# Patient Record
Sex: Male | Born: 1985 | Race: White | Hispanic: No | Marital: Single | Smoking: Former smoker
Health system: Southern US, Community
[De-identification: ages and names within clinical notes are randomized; demographics above are authoritative.]

---

## 2016-03-03 ENCOUNTER — Encounter (HOSPITAL_BASED_OUTPATIENT_CLINIC_OR_DEPARTMENT_OTHER): Payer: Self-pay | Admitting: *Deleted

## 2016-03-03 ENCOUNTER — Emergency Department (HOSPITAL_BASED_OUTPATIENT_CLINIC_OR_DEPARTMENT_OTHER)
Admission: EM | Admit: 2016-03-03 | Discharge: 2016-03-03 | Disposition: A | Discharge status: Home / Self Care | Payer: Self-pay | Attending: Emergency Medicine | Admitting: Emergency Medicine

## 2016-03-03 DIAGNOSIS — R252 Cramp and spasm: Secondary | ICD-10-CM

## 2016-03-03 DIAGNOSIS — M62838 Other muscle spasm: Secondary | ICD-10-CM | POA: Insufficient documentation

## 2016-03-03 DIAGNOSIS — F17210 Nicotine dependence, cigarettes, uncomplicated: Secondary | ICD-10-CM | POA: Insufficient documentation

## 2016-03-03 DIAGNOSIS — R748 Abnormal levels of other serum enzymes: Secondary | ICD-10-CM | POA: Insufficient documentation

## 2016-03-03 LAB — CBC WITH DIFFERENTIAL/PLATELET
BASOS ABS: 0 10*3/uL (ref 0.0–0.1)
BASOS PCT: 0 %
EOS ABS: 0.1 10*3/uL (ref 0.0–0.7)
EOS PCT: 1 %
HCT: 43.3 % (ref 39.0–52.0)
Hemoglobin: 15.1 g/dL (ref 13.0–17.0)
Lymphocytes Relative: 31 %
Lymphs Abs: 2.8 10*3/uL (ref 0.7–4.0)
MCH: 31.2 pg (ref 26.0–34.0)
MCHC: 34.9 g/dL (ref 30.0–36.0)
MCV: 89.5 fL (ref 78.0–100.0)
MONO ABS: 0.8 10*3/uL (ref 0.1–1.0)
Monocytes Relative: 9 %
Neutro Abs: 5.3 10*3/uL (ref 1.7–7.7)
Neutrophils Relative %: 59 %
PLATELETS: 307 10*3/uL (ref 150–400)
RBC: 4.84 MIL/uL (ref 4.22–5.81)
RDW: 12.6 % (ref 11.5–15.5)
WBC: 9 10*3/uL (ref 4.0–10.5)

## 2016-03-03 LAB — COMPREHENSIVE METABOLIC PANEL
ALBUMIN: 5.2 g/dL — AB (ref 3.5–5.0)
ALT: 28 U/L (ref 17–63)
AST: 30 U/L (ref 15–41)
Alkaline Phosphatase: 64 U/L (ref 38–126)
Anion gap: 13 (ref 5–15)
BUN: 11 mg/dL (ref 6–20)
CHLORIDE: 105 mmol/L (ref 101–111)
CO2: 22 mmol/L (ref 22–32)
Calcium: 9.3 mg/dL (ref 8.9–10.3)
Creatinine, Ser: 0.78 mg/dL (ref 0.61–1.24)
GFR calc Af Amer: >60 mL/min (ref >60)
GFR calc non Af Amer: >60 mL/min (ref >60)
GLUCOSE: 89 mg/dL (ref 65–99)
POTASSIUM: 4.2 mmol/L (ref 3.5–5.1)
Sodium: 140 mmol/L (ref 135–145)
Total Bilirubin: 0.8 mg/dL (ref 0.3–1.2)
Total Protein: 8.2 g/dL — ABNORMAL HIGH (ref 6.5–8.1)

## 2016-03-03 LAB — CK: CK TOTAL: 445 U/L — AB (ref 49–397)

## 2016-03-03 MED ORDER — SODIUM CHLORIDE 0.9 % IV SOLN
INTRAVENOUS | Status: DC
  Administered 2016-03-03: 16:00:00 via INTRAVENOUS

## 2016-03-03 NOTE — ED Provider Notes (Signed)
CSN: [id number]     Arrival date & time 03/03/16  1349 History   First MD Initiated Contact with Patient 03/03/16 1504     Chief Complaint  Patient presents with  . Follow-up    HPI Pt was at work today when he started to feel like his heart was beating abnormally.  He then started to have tingling all over.  Walking out to the car, he couldn't speak, he couldn't walk properly and needed help to get to the care.  His fingers were cramped and locked.  His muscles felt very tense.  [facility] went to [facility].  He was given a paper bag.  He had lab tests.  His symptoms slowly resolved after several hours.  He was told he was hyperventilating and was released.  He was told he had a panic attack.  Pt does not feel that is accurate.   History reviewed. No pertinent past medical history. History reviewed. No pertinent past surgical history. No family history on file. Social History  Substance Use Topics  . Smoking status: Current Every Day Smoker -- 1.00 packs/day    Types: Cigarettes  . Smokeless tobacco: None  . Alcohol Use: Yes    Review of Systems  All other systems reviewed and are negative.     Allergies  Review of patient's allergies indicates no known allergies.  Home Medications   Prior to Admission medications   Not on File   BP 127/83 mmHg  Pulse 70  Temp(Src) 98.1 F (36.7 C) (Oral)  Resp 18  Ht 5\' 11"  (1.803 m)  Wt 74.844 kg  BMI 23.02 kg/m2  SpO2 100% Physical Exam  Constitutional: He is oriented to person, place, and time. He appears well-developed and well-nourished. No distress.  HENT:  Head: Normocephalic and atraumatic.  Right Ear: External ear normal.  Left Ear: External ear normal.  Mouth/Throat: Oropharynx is clear and moist.  Eyes: Conjunctivae are normal. Right eye exhibits no discharge. Left eye exhibits no discharge. No scleral icterus.  Neck: Neck supple. No tracheal deviation present.  Cardiovascular: Normal rate, regular rhythm and intact  distal pulses.   Pulmonary/Chest: Effort normal and breath sounds normal. No stridor. No respiratory distress. He has no wheezes. He has no rales.  Abdominal: Soft. Bowel sounds are normal. He exhibits no distension. There is no tenderness. There is no rebound and no guarding.  Musculoskeletal: He exhibits no edema or tenderness.  Neurological: He is alert and oriented to person, place, and time. He has normal strength. No cranial nerve deficit (No facial droop, extraocular movements intact, tongue midline ) or sensory deficit. He exhibits normal muscle tone. He displays no seizure activity. Coordination normal.  No pronator drift bilateral upper extrem, able to hold both legs off bed for 5 seconds, sensation intact in all extremities, no visual field cuts, no left or right sided neglect, normal finger-nose exam bilaterally, no nystagmus noted   Skin: Skin is warm and dry. No rash noted.  Psychiatric: He has a normal mood and affect.  Nursing note and vitals reviewed.   ED Course  Procedures (including critical care time) Labs Review Labs Reviewed  COMPREHENSIVE METABOLIC PANEL - Abnormal; Notable for the following:    Total Protein 8.2 (*)    Albumin 5.2 (*)    All other components within normal limits  CK - Abnormal; Notable for the following:    Total CK 445 (*)    All other components within normal limits  CBC WITH  DIFFERENTIAL/PLATELET    Imaging Review No results found. I have personally reviewed and evaluated these images and lab results as part of my medical decision-making.   EKG Interpretation   Date/Time:  Thursday March 03 2016 15:29:43 EST Ventricular Rate:  85 PR Interval:  147 QRS Duration: 125 QT Interval:  372 QTC Calculation: 442 R Axis:   98 Text Interpretation:  Sinus rhythm RBBB and LPFB Confirmed by Renezmae Canlas  MD-J,  Fredie Majano ([id number]) on 03/03/2016 3:33:04 PM      MDM   Final diagnoses:  Abnormal CK  Spasms of the hands or feet    The patient's symptoms  are suggestive of possible panic attack associated with carpal pedal spasms. He has no prior history of this. He was not feeling anxious or having any particular mental health issues when it occurred.  He questioned this diagnosis when he went to the other emergency room and decided to come here for a second opinion. The patient has remained asymptomatic. I explained to him that I do not think his symptoms were suggestive of a stroke or TIA considering the bilateral nature of his symptoms. His laboratory tests do show mild elevation in the creatinine kinase which would go along with have him having muscle spasms associated with hyperventilation and electrolyte shifts. I discussed follow-up with the primary care doctor to discuss further treatment options and evaluation.  At this time there does not appear to be any evidence of an acute emergency medical condition and the patient appears stable for discharge with appropriate outpatient follow up.     Linwood Dibbles, MD 03/03/16 [phone]

## 2016-03-03 NOTE — ED Notes (Signed)
States he was treated at [facility] for hyperventilation and discharged. He wants to be rechecked because he does not believe his diagnosis was correct. He is very anxious at triage.

## 2016-03-03 NOTE — ED Notes (Signed)
D/c home with ride. Resources given for follow up

## 2016-03-03 NOTE — ED Notes (Signed)
Patient states he was just seen at [facility] and states he does not believe his diagnosis.  States he developed a rapid HR while sitting at his desk at work.  States his symptoms worsened, and he developed generalized muscle contractions, decreased vision and chest pain.  States his symptoms have now resolved and he wants to have another exam.

## 2017-09-11 ENCOUNTER — Emergency Department (HOSPITAL_COMMUNITY): Payer: Self-pay

## 2017-09-11 ENCOUNTER — Inpatient Hospital Stay (HOSPITAL_COMMUNITY)
Admission: EM | Admit: 2017-09-11 | Discharge: 2017-09-13 | DRG: 473 | Disposition: A | Discharge status: Home / Self Care | Payer: Self-pay | Attending: Neurosurgery | Admitting: Neurosurgery

## 2017-09-11 ENCOUNTER — Encounter (HOSPITAL_COMMUNITY): Payer: Self-pay | Admitting: Pharmacy Technician

## 2017-09-11 DIAGNOSIS — M50223 Other cervical disc displacement at C6-C7 level: Secondary | ICD-10-CM | POA: Diagnosis present

## 2017-09-11 DIAGNOSIS — S129XXA Fracture of neck, unspecified, initial encounter: Secondary | ICD-10-CM

## 2017-09-11 DIAGNOSIS — S12500A Unspecified displaced fracture of sixth cervical vertebra, initial encounter for closed fracture: Principal | ICD-10-CM | POA: Diagnosis present

## 2017-09-11 DIAGNOSIS — R402412 Glasgow coma scale score 13-15, at arrival to emergency department: Secondary | ICD-10-CM | POA: Diagnosis present

## 2017-09-11 DIAGNOSIS — Z23 Encounter for immunization: Secondary | ICD-10-CM

## 2017-09-11 DIAGNOSIS — S13101A Dislocation of unspecified cervical vertebrae, initial encounter: Secondary | ICD-10-CM | POA: Diagnosis present

## 2017-09-11 DIAGNOSIS — Z419 Encounter for procedure for purposes other than remedying health state, unspecified: Secondary | ICD-10-CM

## 2017-09-11 DIAGNOSIS — S0101XA Laceration without foreign body of scalp, initial encounter: Secondary | ICD-10-CM | POA: Diagnosis present

## 2017-09-11 DIAGNOSIS — F17210 Nicotine dependence, cigarettes, uncomplicated: Secondary | ICD-10-CM | POA: Diagnosis present

## 2017-09-11 MED ORDER — TETANUS-DIPHTH-ACELL PERTUSSIS 5-2.5-18.5 LF-MCG/0.5 IM SUSP
0.5000 mL | Freq: Once | INTRAMUSCULAR | Status: AC
  Administered 2017-09-12: 0.5 mL via INTRAMUSCULAR
  Filled 2017-09-11: qty 0.5
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
## 2017-09-12 ENCOUNTER — Encounter (HOSPITAL_COMMUNITY): Payer: Self-pay | Admitting: Certified Registered"

## 2017-09-12 ENCOUNTER — Emergency Department (HOSPITAL_COMMUNITY): Payer: Self-pay

## 2017-09-12 ENCOUNTER — Encounter (HOSPITAL_COMMUNITY)
Admission: EM | Disposition: A | Discharge status: Home / Self Care | Payer: Self-pay | Source: Home / Self Care | Attending: Neurosurgery

## 2017-09-12 ENCOUNTER — Emergency Department (HOSPITAL_COMMUNITY): Payer: Self-pay | Admitting: Certified Registered"

## 2017-09-12 DIAGNOSIS — S13101A Dislocation of unspecified cervical vertebrae, initial encounter: Secondary | ICD-10-CM | POA: Diagnosis present

## 2017-09-12 HISTORY — PX: ANTERIOR CERVICAL DECOMP/DISCECTOMY FUSION: SHX1161

## 2017-09-12 LAB — SAMPLE TO BLOOD BANK

## 2017-09-12 LAB — COMPREHENSIVE METABOLIC PANEL
ALBUMIN: 4.1 g/dL (ref 3.5–5.0)
ALT: 49 U/L (ref 17–63)
AST: 85 U/L — ABNORMAL HIGH (ref 15–41)
Alkaline Phosphatase: 63 U/L (ref 38–126)
Anion gap: 10 (ref 5–15)
BUN: 11 mg/dL (ref 6–20)
CALCIUM: 8.8 mg/dL — AB (ref 8.9–10.3)
CO2: 22 mmol/L (ref 22–32)
CREATININE: 1.11 mg/dL (ref 0.61–1.24)
Chloride: 109 mmol/L (ref 101–111)
GFR calc non Af Amer: >60 mL/min (ref >60)
Glucose, Bld: 124 mg/dL — ABNORMAL HIGH (ref 65–99)
Potassium: 3.5 mmol/L (ref 3.5–5.1)
SODIUM: 141 mmol/L (ref 135–145)
Total Bilirubin: 0.4 mg/dL (ref 0.3–1.2)
Total Protein: 6.8 g/dL (ref 6.5–8.1)

## 2017-09-12 LAB — I-STAT CHEM 8, ED
BUN: 13 mg/dL (ref 6–20)
CALCIUM ION: 1.12 mmol/L — AB (ref 1.15–1.40)
Chloride: 109 mmol/L (ref 101–111)
Creatinine, Ser: 1.1 mg/dL (ref 0.61–1.24)
Glucose, Bld: 124 mg/dL — ABNORMAL HIGH (ref 65–99)
HCT: 43 % (ref 39.0–52.0)
HEMOGLOBIN: 14.6 g/dL (ref 13.0–17.0)
POTASSIUM: 3.6 mmol/L (ref 3.5–5.1)
SODIUM: 144 mmol/L (ref 135–145)
TCO2: 25 mmol/L (ref 22–32)

## 2017-09-12 LAB — CBC
HCT: 42 % (ref 39.0–52.0)
Hemoglobin: 14.1 g/dL (ref 13.0–17.0)
MCH: 31.7 pg (ref 26.0–34.0)
MCHC: 33.6 g/dL (ref 30.0–36.0)
MCV: 94.4 fL (ref 78.0–100.0)
PLATELETS: 287 10*3/uL (ref 150–400)
RBC: 4.45 MIL/uL (ref 4.22–5.81)
RDW: 12.9 % (ref 11.5–15.5)
WBC: 14.2 10*3/uL — AB (ref 4.0–10.5)

## 2017-09-12 LAB — I-STAT CG4 LACTIC ACID, ED: Lactic Acid, Venous: 2.6 mmol/L (ref 0.5–1.9)

## 2017-09-12 LAB — CDS SEROLOGY

## 2017-09-12 LAB — ETHANOL: ALCOHOL ETHYL (B): 130 mg/dL — AB (ref <5)

## 2017-09-12 LAB — PROTIME-INR
INR: 0.96
PROTHROMBIN TIME: 12.7 s (ref 11.4–15.2)

## 2017-09-12 SURGERY — ANTERIOR CERVICAL DECOMPRESSION/DISCECTOMY FUSION 1 LEVEL
Anesthesia: General | Site: Spine Cervical

## 2017-09-12 MED ORDER — MENTHOL 3 MG MT LOZG: 1.0000 | LOZENGE | OROMUCOSAL | Status: DC | PRN

## 2017-09-12 MED ORDER — SUFENTANIL CITRATE 50 MCG/ML IV SOLN
INTRAVENOUS | Status: DC | PRN
  Administered 2017-09-12: 5 ug via INTRAVENOUS
  Administered 2017-09-12: 10 ug via INTRAVENOUS
  Administered 2017-09-12 (×2): 5 ug via INTRAVENOUS
  Administered 2017-09-12: 15 ug via INTRAVENOUS
  Administered 2017-09-12: 10 ug via INTRAVENOUS

## 2017-09-12 MED ORDER — DOCUSATE SODIUM 100 MG PO CAPS
100.0000 mg | ORAL_CAPSULE | Freq: Two times a day (BID) | ORAL | Status: DC
  Administered 2017-09-12 – 2017-09-13 (×3): 100 mg via ORAL
  Filled 2017-09-12 (×3): qty 1

## 2017-09-12 MED ORDER — DIAZEPAM 5 MG PO TABS
5.0000 mg | ORAL_TABLET | Freq: Four times a day (QID) | ORAL | Status: DC | PRN
  Administered 2017-09-12 – 2017-09-13 (×3): 5 mg via ORAL
  Filled 2017-09-12 (×3): qty 1

## 2017-09-12 MED ORDER — SUGAMMADEX SODIUM 200 MG/2ML IV SOLN
INTRAVENOUS | Status: DC | PRN
  Administered 2017-09-12: 150 mg via INTRAVENOUS

## 2017-09-12 MED ORDER — MIDAZOLAM HCL 2 MG/2ML IJ SOLN
INTRAMUSCULAR | Status: AC
  Filled 2017-09-12: qty 2

## 2017-09-12 MED ORDER — ONDANSETRON HCL 4 MG/2ML IJ SOLN
INTRAMUSCULAR | Status: DC | PRN
  Administered 2017-09-12: 4 mg via INTRAVENOUS

## 2017-09-12 MED ORDER — SUGAMMADEX SODIUM 200 MG/2ML IV SOLN
INTRAVENOUS | Status: AC
  Filled 2017-09-12: qty 2

## 2017-09-12 MED ORDER — MAGNESIUM CITRATE PO SOLN: 1.0000 | Freq: Once | ORAL | Status: DC | PRN

## 2017-09-12 MED ORDER — DEXAMETHASONE SODIUM PHOSPHATE 10 MG/ML IJ SOLN
INTRAMUSCULAR | Status: DC | PRN
  Administered 2017-09-12: 10 mg via INTRAVENOUS

## 2017-09-12 MED ORDER — SUFENTANIL CITRATE 50 MCG/ML IV SOLN
INTRAVENOUS | Status: AC
  Filled 2017-09-12: qty 1

## 2017-09-12 MED ORDER — GABAPENTIN 300 MG PO CAPS
300.0000 mg | ORAL_CAPSULE | Freq: Three times a day (TID) | ORAL | Status: DC
  Administered 2017-09-12 – 2017-09-13 (×4): 300 mg via ORAL
  Filled 2017-09-12 (×4): qty 1

## 2017-09-12 MED ORDER — ONDANSETRON HCL 4 MG/2ML IJ SOLN: 4.0000 mg | Freq: Four times a day (QID) | INTRAMUSCULAR | Status: DC | PRN

## 2017-09-12 MED ORDER — SODIUM CHLORIDE 0.9 % IV SOLN: 250.0000 mL | INTRAVENOUS | Status: DC

## 2017-09-12 MED ORDER — ACETAMINOPHEN 500 MG PO TABS
1000.0000 mg | ORAL_TABLET | Freq: Four times a day (QID) | ORAL | Status: AC
  Administered 2017-09-12 – 2017-09-13 (×4): 1000 mg via ORAL
  Filled 2017-09-12 (×4): qty 2

## 2017-09-12 MED ORDER — CEFAZOLIN SODIUM-DEXTROSE 2-3 GM-% IV SOLR
INTRAVENOUS | Status: DC | PRN
  Administered 2017-09-12: 2 g via INTRAVENOUS

## 2017-09-12 MED ORDER — PHENYLEPHRINE 40 MCG/ML (10ML) SYRINGE FOR IV PUSH (FOR BLOOD PRESSURE SUPPORT)
PREFILLED_SYRINGE | INTRAVENOUS | Status: DC | PRN
  Administered 2017-09-12 (×2): 40 ug via INTRAVENOUS
  Administered 2017-09-12: 80 ug via INTRAVENOUS
  Administered 2017-09-12: 40 ug via INTRAVENOUS

## 2017-09-12 MED ORDER — LIDOCAINE-EPINEPHRINE 0.5 %-1:200000 IJ SOLN
INTRAMUSCULAR | Status: DC | PRN
  Administered 2017-09-12: 4 mL

## 2017-09-12 MED ORDER — THROMBIN 5000 UNITS EX SOLR
CUTANEOUS | Status: DC | PRN
  Administered 2017-09-12 (×2): 5000 [IU] via TOPICAL

## 2017-09-12 MED ORDER — ACETAMINOPHEN 650 MG RE SUPP
650.0000 mg | RECTAL | Status: DC | PRN
Start: 2017-09-12 — End: 2017-09-13

## 2017-09-12 MED ORDER — OXYCODONE HCL ER 10 MG PO T12A
10.0000 mg | EXTENDED_RELEASE_TABLET | Freq: Two times a day (BID) | ORAL | Status: DC
  Administered 2017-09-12 – 2017-09-13 (×3): 10 mg via ORAL
  Filled 2017-09-12 (×3): qty 1

## 2017-09-12 MED ORDER — OXYCODONE HCL 5 MG PO TABS
5.0000 mg | ORAL_TABLET | ORAL | Status: DC | PRN
  Administered 2017-09-12 – 2017-09-13 (×5): 10 mg via ORAL
  Filled 2017-09-12 (×5): qty 2

## 2017-09-12 MED ORDER — POTASSIUM CHLORIDE IN NACL 20-0.9 MEQ/L-% IV SOLN: INTRAVENOUS | Status: DC

## 2017-09-12 MED ORDER — PROPOFOL 10 MG/ML IV BOLUS
INTRAVENOUS | Status: AC
  Filled 2017-09-12: qty 20

## 2017-09-12 MED ORDER — PHENOL 1.4 % MT LIQD: 1.0000 | OROMUCOSAL | Status: DC | PRN

## 2017-09-12 MED ORDER — ACETAMINOPHEN 325 MG PO TABS: 650.0000 mg | ORAL_TABLET | ORAL | Status: DC | PRN

## 2017-09-12 MED ORDER — HYDROMORPHONE HCL 1 MG/ML IJ SOLN: 0.2500 mg | INTRAMUSCULAR | Status: DC | PRN

## 2017-09-12 MED ORDER — SODIUM CHLORIDE 0.9% FLUSH: 3.0000 mL | INTRAVENOUS | Status: DC | PRN

## 2017-09-12 MED ORDER — BISACODYL 5 MG PO TBEC: 5.0000 mg | DELAYED_RELEASE_TABLET | Freq: Every day | ORAL | Status: DC | PRN

## 2017-09-12 MED ORDER — THROMBIN 5000 UNITS EX SOLR
CUTANEOUS | Status: AC
  Filled 2017-09-12: qty 10000

## 2017-09-12 MED ORDER — IOPAMIDOL (ISOVUE-300) INJECTION 61%
INTRAVENOUS | Status: AC
  Administered 2017-09-12: 100 mL via INTRAVENOUS
  Filled 2017-09-12: qty 100

## 2017-09-12 MED ORDER — SENNOSIDES-DOCUSATE SODIUM 8.6-50 MG PO TABS: 1.0000 | ORAL_TABLET | Freq: Every evening | ORAL | Status: DC | PRN

## 2017-09-12 MED ORDER — ONDANSETRON HCL 4 MG PO TABS: 4.0000 mg | ORAL_TABLET | Freq: Four times a day (QID) | ORAL | Status: DC | PRN

## 2017-09-12 MED ORDER — PROPOFOL 10 MG/ML IV BOLUS
INTRAVENOUS | Status: DC | PRN
  Administered 2017-09-12: 130 mg via INTRAVENOUS

## 2017-09-12 MED ORDER — LACTATED RINGERS IV SOLN
INTRAVENOUS | Status: DC | PRN
  Administered 2017-09-12 (×2): via INTRAVENOUS

## 2017-09-12 MED ORDER — MORPHINE SULFATE (PF) 4 MG/ML IV SOLN: 1.0000 mg | INTRAVENOUS | Status: DC | PRN

## 2017-09-12 MED ORDER — PHENYLEPHRINE 40 MCG/ML (10ML) SYRINGE FOR IV PUSH (FOR BLOOD PRESSURE SUPPORT)
PREFILLED_SYRINGE | INTRAVENOUS | Status: AC
  Filled 2017-09-12: qty 10

## 2017-09-12 MED ORDER — HEMOSTATIC AGENTS (NO CHARGE) OPTIME
TOPICAL | Status: DC | PRN
  Administered 2017-09-12: 1 via TOPICAL

## 2017-09-12 MED ORDER — ROCURONIUM BROMIDE 100 MG/10ML IV SOLN
INTRAVENOUS | Status: DC | PRN
  Administered 2017-09-12: 40 mg via INTRAVENOUS

## 2017-09-12 MED ORDER — 0.9 % SODIUM CHLORIDE (POUR BTL) OPTIME
TOPICAL | Status: DC | PRN
Start: 2017-09-12 — End: 2017-09-12
  Administered 2017-09-12: 1000 mL

## 2017-09-12 MED ORDER — SUCCINYLCHOLINE CHLORIDE 20 MG/ML IJ SOLN
INTRAMUSCULAR | Status: DC | PRN
  Administered 2017-09-12: 100 mg via INTRAVENOUS

## 2017-09-12 MED ORDER — MIDAZOLAM HCL 5 MG/5ML IJ SOLN
INTRAMUSCULAR | Status: DC | PRN
  Administered 2017-09-12: 2 mg via INTRAVENOUS

## 2017-09-12 MED ORDER — SODIUM CHLORIDE 0.9% FLUSH: 3.0000 mL | Freq: Two times a day (BID) | INTRAVENOUS | Status: DC

## 2017-09-12 MED ORDER — LIDOCAINE HCL (CARDIAC) 20 MG/ML IV SOLN
INTRAVENOUS | Status: DC | PRN
  Administered 2017-09-12: 100 mg via INTRATRACHEAL

## 2017-09-12 MED ORDER — LIDOCAINE-EPINEPHRINE 0.5 %-1:200000 IJ SOLN
INTRAMUSCULAR | Status: AC
  Filled 2017-09-12: qty 1

## 2017-09-12 SURGICAL SUPPLY — 52 items
BUR DRUM 4.0 (BURR) ×2 IMPLANT
BUR DRUM 4.0MM (BURR) ×1
BUR MATCHSTICK NEURO 3.0 LAGG (BURR) ×3 IMPLANT
CANISTER SUCT 3000ML PPV (MISCELLANEOUS) ×3 IMPLANT
CARTRIDGE OIL MAESTRO DRILL (MISCELLANEOUS) ×1 IMPLANT
DERMABOND ADVANCED (GAUZE/BANDAGES/DRESSINGS) ×2
DERMABOND ADVANCED .7 DNX12 (GAUZE/BANDAGES/DRESSINGS) ×1 IMPLANT
DIFFUSER DRILL AIR PNEUMATIC (MISCELLANEOUS) ×3 IMPLANT
DRAPE HALF SHEET 40X57 (DRAPES) IMPLANT
DRAPE LAPAROTOMY 100X72 PEDS (DRAPES) ×3 IMPLANT
DRAPE MICROSCOPE LEICA (MISCELLANEOUS) ×3 IMPLANT
DRAPE POUCH INSTRU U-SHP 10X18 (DRAPES) ×3 IMPLANT
DURAPREP 6ML APPLICATOR 50/CS (WOUND CARE) ×3 IMPLANT
ELECT COATED BLADE 2.86 ST (ELECTRODE) ×3 IMPLANT
ELECT REM PT RETURN 9FT ADLT (ELECTROSURGICAL) ×3
ELECTRODE REM PT RTRN 9FT ADLT (ELECTROSURGICAL) ×1 IMPLANT
GAUZE SPONGE 4X4 16PLY XRAY LF (GAUZE/BANDAGES/DRESSINGS) IMPLANT
GLOVE BIOGEL PI IND STRL 7.5 (GLOVE) ×1 IMPLANT
GLOVE BIOGEL PI IND STRL 8 (GLOVE) ×2 IMPLANT
GLOVE BIOGEL PI INDICATOR 7.5 (GLOVE) ×2
GLOVE BIOGEL PI INDICATOR 8 (GLOVE) ×4
GLOVE ECLIPSE 6.5 STRL STRAW (GLOVE) ×3 IMPLANT
GLOVE ECLIPSE 7.5 STRL STRAW (GLOVE) ×9 IMPLANT
GLOVE SS BIOGEL STRL SZ 7 (GLOVE) ×1 IMPLANT
GLOVE SUPERSENSE BIOGEL SZ 7 (GLOVE) ×2
GOWN STRL REUS W/ TWL LRG LVL3 (GOWN DISPOSABLE) ×2 IMPLANT
GOWN STRL REUS W/ TWL XL LVL3 (GOWN DISPOSABLE) IMPLANT
GOWN STRL REUS W/TWL 2XL LVL3 (GOWN DISPOSABLE) ×3 IMPLANT
GOWN STRL REUS W/TWL LRG LVL3 (GOWN DISPOSABLE) ×4
GOWN STRL REUS W/TWL XL LVL3 (GOWN DISPOSABLE)
KIT BASIN OR (CUSTOM PROCEDURE TRAY) ×3 IMPLANT
KIT ROOM TURNOVER OR (KITS) ×3 IMPLANT
NEEDLE HYPO 25X1 1.5 SAFETY (NEEDLE) ×3 IMPLANT
NEEDLE SPNL 22GX3.5 QUINCKE BK (NEEDLE) ×3 IMPLANT
NS IRRIG 1000ML POUR BTL (IV SOLUTION) ×3 IMPLANT
OIL CARTRIDGE MAESTRO DRILL (MISCELLANEOUS) ×3
PACK LAMINECTOMY NEURO (CUSTOM PROCEDURE TRAY) ×3 IMPLANT
PAD ARMBOARD 7.5X6 YLW CONV (MISCELLANEOUS) ×9 IMPLANT
PIN DISTRACTION 12MM (PIN) ×4
PIN DSTRCT 12XNS LF REUSE (PIN) ×2 IMPLANT
PLATE LV 1 12MM (Plate) ×3 IMPLANT
RUBBERBAND STERILE (MISCELLANEOUS) ×6 IMPLANT
SCREW SELF TAP 4.0X14MM (Screw) ×12 IMPLANT
SPACER ACF PARALLEL 7MM (Bone Implant) ×3 IMPLANT
SPONGE INTESTINAL PEANUT (DISPOSABLE) ×3 IMPLANT
SPONGE SURGIFOAM ABS GEL SZ50 (HEMOSTASIS) ×3 IMPLANT
SUT VIC AB 0 CT1 27 (SUTURE) ×2
SUT VIC AB 0 CT1 27XBRD ANTBC (SUTURE) ×1 IMPLANT
SUT VIC AB 3-0 SH 8-18 (SUTURE) ×6 IMPLANT
TOWEL GREEN STERILE (TOWEL DISPOSABLE) ×3 IMPLANT
TOWEL GREEN STERILE FF (TOWEL DISPOSABLE) ×3 IMPLANT
WATER STERILE IRR 1000ML POUR (IV SOLUTION) ×3 IMPLANT
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
## 2017-09-12 NOTE — ED Notes (Signed)
To OR at this time.

## 2017-09-12 NOTE — Anesthesia Procedure Notes (Signed)
Procedure Name: Intubation Date/Time: 09/12/2017 5:49 AM Performed by: Melina Schools Pre-anesthesia Checklist: Patient identified, Emergency Drugs available, Suction available, Patient being monitored and Timeout performed Patient Re-evaluated:Patient Re-evaluated prior to induction Oxygen Delivery Method: Circle system utilized Preoxygenation: Pre-oxygenation with 100% oxygen Induction Type: IV induction, Rapid sequence and Cricoid Pressure applied Laryngoscope Size: Glidescope and 3 Grade View: Grade II Tube type: Oral Tube size: 7.5 mm Number of attempts: 1 Airway Equipment and Method: Video-laryngoscopy and Stylet Placement Confirmation: positive ETCO2 and breath sounds checked- equal and bilateral Secured at: 24 cm Tube secured with: Tape Dental Injury: Teeth and Oropharynx as per pre-operative assessment

## 2017-09-12 NOTE — ED Notes (Signed)
Dr. Cabbell at bedside 

## 2017-09-12 NOTE — Transfer of Care (Signed)
Immediate Anesthesia Transfer of Care Note  Patient: Thomas Jarvis  Procedure(s) Performed: Procedure(s): CERVICAL SIX-SEVEN ANTERIOR CERVICAL DECOMPRESSION/DISCECTOMY FUSION (N/A)  Patient Location: PACU  Anesthesia Type:General  Level of Consciousness: awake, alert , oriented and patient cooperative  Airway & Oxygen Therapy: Patient Spontanous Breathing and Patient connected to nasal cannula oxygen  Post-op Assessment: Report given to RN and Post -op Vital signs reviewed and stable  Post vital signs: Reviewed  Last Vitals: 122/70, 118, 18, 100% Vitals:   09/12/17 0500 09/12/17 0756  BP: 124/66   Pulse: (!) 106   Resp: (!) 27   Temp:  37.2 C  SpO2: 94%     Last Pain:  Vitals:   09/12/17 0427  TempSrc:   PainSc: 7          Complications: No apparent anesthesia complications
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
## 2017-09-13 ENCOUNTER — Encounter (HOSPITAL_COMMUNITY): Payer: Self-pay | Admitting: Neurosurgery

## 2017-09-13 MED ORDER — ALUM & MAG HYDROXIDE-SIMETH 200-200-20 MG/5ML PO SUSP
30.0000 mL | ORAL | Status: DC | PRN
  Administered 2017-09-13: 30 mL via ORAL
  Filled 2017-09-13: qty 30

## 2017-09-13 MED ORDER — TIZANIDINE HCL 4 MG PO TABS: 4.0000 mg | ORAL_TABLET | Freq: Four times a day (QID) | ORAL | 0 refills | Status: AC | PRN

## 2017-09-13 MED ORDER — HYDROCODONE-ACETAMINOPHEN 5-325 MG PO TABS: 1.0000 | ORAL_TABLET | Freq: Four times a day (QID) | ORAL | 0 refills | Status: AC | PRN
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
## 2017-09-13 NOTE — Discharge Instructions (Addendum)
Anterior Cervical Fusion °Care After °Pinching of the nerves is a common cause of long-term pain. When this happens, a procedure called an anterior cervical fusion is sometimes performed. It relieves the pressure on the pinched nerve roots or spinal cord in the neck. °An anterior cervical fusion means that the operation is done through the front (anterior) of your neck to fuse bones in your neck together. This procedure is done to relieve the pressure on pinched nerve roots or spinal cord. This operation is done to control the movement of your spine, which may be pressing on the nerves. This may relieve the pain. The procedure that stops the movement of the spine is called a fusion. The cut by the surgeon (incision) is usually within a skin fold line under your chin. After moving the neck muscles gently apart, the neurosurgeon uses an operating microscope and removes the injured intervertebral disk (the cushion or pad of tissue between the bones of the spine). This takes the pressure off the nerves or spinal cord. This is called decompression. The area where the disc was removed is then filled with a bone graft. The graft will fuse the vertebrae together over time. This means it causes the vertebral bodies to grow together. The bone graft may be obtained from your own bone (your hip for example), or may be obtained from a bone bank. Receiving bone from a bone bank is similar to a blood bank, only the bone comes from human donors who have recently died. This type of graft is referred to as allograft bone. The preformed bone plug is safe and will not be rejected by your body. It does not contain blood cells. °In some cases, the surgeon may use hardware in your neck to help stabilize it. This means that metal plates or pins or screws may be used to: °· Provide extra support to the neck.  °· Help the bones to grow together more easily.  °A cervical fusion procedure takes a couple hours to several hours, depending on  what needs to be done. Your caregiver will be able to answer your questions for you. °HOME CARE INSTRUCTIONS  °· It will be normal to have a sore throat and have difficulty swallowing foods for a couple weeks following surgery. See your caregiver if this seems to be getting worse rather than better.  °· You may resume normal diet and activities as directed or allowed. Generally, walking and stair climbing are fine. Avoid lifting more than ten pounds and do no lifting above your head.  °· If given a cervical collar, remove only for bathing and eating, or as directed.  °· Use only showers for cleaning up, with no bathing, until seen.  °· You may apply ice to the surgical or bone donor site for 15 to 20 minutes each hour while awake for the first couple days following surgery. Put the ice in a plastic bag and place a towel between the bag of ice and your skin.  °· Change dressings if necessary or as directed.  °· You may drive in 10 days  °· Take prescribed medication as directed. Only take over-the-counter or prescription medicines for pain, discomfort, or fever as directed by your caregiver.  °· Make an appointment to see your caregiver for suture or staple removal when instructed.  °· If physical therapy was prescribed, follow your caregiver's directions.  °SEEK IMMEDIATE MEDICAL CARE IF: °· There is redness, swelling, or increasing pain in the wound.  °· There is   pus coming from the wound.   An unexplained oral temperature over 102 F (38.9 C) develops.   There is a bad smell coming from the wound or dressing.   You have swelling in your calf or leg.   You develop shortness of breath or chest pain.   The wound edges break open after sutures or staples have been removed.   Your pain is not controlled with medicine.   You seem to be getting worse rather than better.   You must wear the collar at all times. Shower with the collar, lie down on a flat surface. Change the pads on the collar, then replace  the collar while lying. Do this each day.    EDUCATION  Wound Care Leave incision open to air. You may shower. Do not scrub directly on incision.  Do not put any creams, lotions, or ointments on incision. Activity Walk each and every day, increasing distance each day. No lifting greater than 5 lbs.  Avoid excessive neck motion. No driving for 2 weeks; may ride as a passenger locally. Wear neck brace at all times except when showering.  If provided soft collar, may wear for comfort unless otherwise instructed. Diet Resume your normal diet.  Return to Work Will be discussed at you follow up appointment. Call Your Doctor If Any of These Occur Redness, drainage, or swelling at the wound.  Temperature greater than 101 degrees. Severe pain not relieved by pain medication. Increased difficulty swallowing. Incision starts to come apart. Follow Up Appt Call today for appointment in 4 weeks ([phone]) or for problems.  If you have any hardware placed in your spine, you will need an x-ray before your appointment.

## 2017-09-13 NOTE — Progress Notes (Signed)
Patient alert and oriented, mae's well, voiding adequate amount of urine, swallowing without difficulty, no c/o pain at time of discharge. Patient discharged home with family. Script and discharged instructions given to patient. Patient and family stated understanding of instructions given. Patient has an appointment with Dr. Cabbell   
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
## 2017-09-14 NOTE — Anesthesia Postprocedure Evaluation (Signed)
Anesthesia Post Note  Patient: Mindi Curling  Procedure(s) Performed: Procedure(s) (LRB): CERVICAL SIX-SEVEN ANTERIOR CERVICAL DECOMPRESSION/DISCECTOMY FUSION (N/A)     Patient location during evaluation: PACU Anesthesia Type: General Level of consciousness: awake and alert Pain management: pain level controlled Vital Signs Assessment: post-procedure vital signs reviewed and stable Respiratory status: spontaneous breathing, nonlabored ventilation, respiratory function stable and patient connected to nasal cannula oxygen Cardiovascular status: blood pressure returned to baseline and stable Postop Assessment: no apparent nausea or vomiting Anesthetic complications: no    Last Vitals:  Vitals:   09/13/17 0821 09/13/17 1243  BP: (!) 151/90 (!) 144/96  Pulse: 79 75  Resp: 18 18  Temp: 36.8 C 36.8 C  SpO2: 99% 99%    Last Pain:  Vitals:   09/13/17 1328  TempSrc:   PainSc: 3                  Darrion Macaulay

## 2018-06-18 ENCOUNTER — Encounter (HOSPITAL_BASED_OUTPATIENT_CLINIC_OR_DEPARTMENT_OTHER): Payer: Self-pay | Admitting: Emergency Medicine

## 2018-06-18 ENCOUNTER — Other Ambulatory Visit: Payer: Self-pay

## 2018-06-18 ENCOUNTER — Emergency Department (HOSPITAL_BASED_OUTPATIENT_CLINIC_OR_DEPARTMENT_OTHER)
Admission: EM | Admit: 2018-06-18 | Discharge: 2018-06-18 | Disposition: A | Discharge status: Home / Self Care | Payer: Self-pay | Attending: Emergency Medicine | Admitting: Emergency Medicine

## 2018-06-18 ENCOUNTER — Emergency Department (HOSPITAL_BASED_OUTPATIENT_CLINIC_OR_DEPARTMENT_OTHER): Payer: Self-pay

## 2018-06-18 DIAGNOSIS — L02211 Cutaneous abscess of abdominal wall: Secondary | ICD-10-CM | POA: Insufficient documentation

## 2018-06-18 DIAGNOSIS — L0291 Cutaneous abscess, unspecified: Secondary | ICD-10-CM

## 2018-06-18 DIAGNOSIS — Z87891 Personal history of nicotine dependence: Secondary | ICD-10-CM | POA: Insufficient documentation

## 2018-06-18 LAB — CBC WITH DIFFERENTIAL/PLATELET
BASOS PCT: 0 %
Basophils Absolute: 0 10*3/uL (ref 0.0–0.1)
Eosinophils Absolute: 0.1 10*3/uL (ref 0.0–0.7)
Eosinophils Relative: 1 %
HCT: 38.7 % — ABNORMAL LOW (ref 39.0–52.0)
HEMOGLOBIN: 13.2 g/dL (ref 13.0–17.0)
LYMPHS ABS: 2.2 10*3/uL (ref 0.7–4.0)
LYMPHS PCT: 19 %
MCH: 31.7 pg (ref 26.0–34.0)
MCHC: 34.1 g/dL (ref 30.0–36.0)
MCV: 93 fL (ref 78.0–100.0)
MONO ABS: 1.4 10*3/uL — AB (ref 0.1–1.0)
Monocytes Relative: 12 %
NEUTROS ABS: 7.6 10*3/uL (ref 1.7–7.7)
NEUTROS PCT: 68 %
Platelets: 272 10*3/uL (ref 150–400)
RBC: 4.16 MIL/uL — ABNORMAL LOW (ref 4.22–5.81)
RDW: 12.4 % (ref 11.5–15.5)
WBC: 11.4 10*3/uL — ABNORMAL HIGH (ref 4.0–10.5)

## 2018-06-18 LAB — BASIC METABOLIC PANEL
ANION GAP: 6 (ref 5–15)
BUN: 13 mg/dL (ref 6–20)
CALCIUM: 8.6 mg/dL — AB (ref 8.9–10.3)
CHLORIDE: 105 mmol/L (ref 101–111)
CO2: 27 mmol/L (ref 22–32)
Creatinine, Ser: 0.9 mg/dL (ref 0.61–1.24)
GFR calc non Af Amer: >60 mL/min (ref >60)
GLUCOSE: 95 mg/dL (ref 65–99)
Potassium: 4.2 mmol/L (ref 3.5–5.1)
Sodium: 138 mmol/L (ref 135–145)

## 2018-06-18 MED ORDER — FENTANYL CITRATE (PF) 100 MCG/2ML IJ SOLN
50.0000 ug | Freq: Once | INTRAMUSCULAR | Status: AC
  Administered 2018-06-18: 50 ug via INTRAVENOUS
  Filled 2018-06-18: qty 2

## 2018-06-18 MED ORDER — LIDOCAINE HCL (PF) 1 % IJ SOLN
10.0000 mL | Freq: Once | INTRAMUSCULAR | Status: AC
  Administered 2018-06-18: 10 mL via INTRADERMAL
  Filled 2018-06-18: qty 10

## 2018-06-18 MED ORDER — HYDROCODONE-ACETAMINOPHEN 5-325 MG PO TABS
2.0000 | ORAL_TABLET | Freq: Once | ORAL | Status: AC
  Administered 2018-06-18: 2 via ORAL
  Filled 2018-06-18: qty 2

## 2018-06-18 NOTE — ED Provider Notes (Signed)
[facility] EMERGENCY DEPARTMENT Provider Note   CSN: [id number] Arrival date & time: 06/18/18  4098     History   Chief Complaint Chief Complaint  Patient presents with  . Abscess    HPI Thomas Jarvis is a 32 y.o. male with no significant past medical history who presents for evaluation of abscess to suprapubic region that has been ongoing for the last 6 days. He reports that pain, redness and swelling to the area increased so he went to [facility] yesterday for evaluation of symptoms. At that time, he was told the abscess was too deep to cut. He was given a shot of rocephin and discharged home with bactrim and keflex which he has been taking since yesterday. Patient reports that he comes in today with increased pain and redness. He states he marked the area yesterday after going to UC and states that the redness started spreading beyond the marked boarders, prompting ED visit today. He reports he had a temperature of 99.2 at the Urgent Care yesterday but no other fevers at home. Patient reports the area is sore and hurts more to move. Patient denies any nausea, vomiting, dysuria, hematuria.   The history is provided by the patient.    History reviewed. No pertinent past medical history.  Patient Active Problem List   Diagnosis Date Noted  . 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 ANTERIOR CERVICAL DECOMP/DISCECTOMY FUSION N/A 09/12/2017   Procedure: CERVICAL SIX-SEVEN ANTERIOR CERVICAL DECOMPRESSION/DISCECTOMY FUSION;  Surgeon: Coletta Memos, MD;  Location: [facility] OR;  Service: Neurosurgery;  Laterality: N/A;        Home Medications    Prior to Admission medications   Medication Sig Start Date End Date Taking? Authorizing Provider  cephALEXin (KEFLEX) 500 MG capsule Take 500 mg by mouth 4 (four) times daily.   Yes [provider]  sulfamethoxazole-trimethoprim (BACTRIM  DS,SEPTRA DS) 800-160 MG tablet Take 1 tablet by mouth 2 (two) times daily.   Yes [provider]  HYDROcodone-acetaminophen (NORCO/VICODIN) 5-325 MG tablet Take 1 tablet by mouth every 6 (six) hours as needed for moderate pain. 09/13/17   Coletta Memos, MD  tiZANidine (ZANAFLEX) 4 MG tablet Take 1 tablet (4 mg total) by mouth every 6 (six) hours as needed for muscle spasms. 09/13/17   Coletta Memos, MD    Family History No family history on file.  Social History Social History   Tobacco Use  . Smoking status: Former Smoker    Packs/day: 1.00    Types: Cigarettes  . Smokeless tobacco: Never Used  Substance Use Topics  . Alcohol use: Yes    Comment: occ  . Drug use: Not Currently     Allergies   Patient has no known allergies.   Review of Systems Review of Systems  Constitutional: Negative for fever.  Gastrointestinal: Positive for abdominal pain.  Genitourinary: Negative for dysuria and hematuria.  Skin: Positive for color change.     Physical Exam Updated Vital Signs BP 131/90 (BP Location: Left Arm)   Pulse 82   Temp 98.7 F (37.1 C) (Oral)   Resp 18   Ht 5\' 11"  (1.803 m)   Wt 76.7 kg (169 lb)   SpO2 100%   BMI 23.57 kg/m   Physical Exam  Constitutional: He appears well-developed and well-nourished.  HENT:  Head: Normocephalic and atraumatic.  Eyes: Conjunctivae and EOM are normal. Right eye exhibits no discharge.  Left eye exhibits no discharge. No scleral icterus.  Pulmonary/Chest: Effort normal.  Abdominal: There is tenderness in the suprapubic area.    Area of warmth, erythema, induration noted to the suprapubic region with palpable mass. No fluctuance. Tenderness to the area.   Neurological: He is alert.  Skin: Skin is warm and dry.  Psychiatric: He has a normal mood and affect. His speech is normal and behavior is normal.  Nursing note and vitals reviewed.    ED Treatments / Results  Labs (all labs ordered are listed, but only abnormal  results are displayed) Labs Reviewed  BASIC METABOLIC PANEL - Abnormal; Notable for the following components:      Result Value   Calcium 8.6 (*)    All other components within normal limits  CBC WITH DIFFERENTIAL/PLATELET - Abnormal; Notable for the following components:   WBC 11.4 (*)    RBC 4.16 (*)    HCT 38.7 (*)    Monocytes Absolute 1.4 (*)    All other components within normal limits    EKG None  Radiology No results found.  Procedures .Marland KitchenIncision and Drainage Date/Time: 06/18/2018 11:23 AM Performed by: Maxwell Caul, PA-C Authorized by: Maxwell Caul, PA-C   Consent:    Consent obtained:  Verbal   Consent given by:  Patient   Risks discussed:  Bleeding, incomplete drainage, pain and infection Location:    Type:  Abscess   Location:  Trunk   Trunk location:  Abdomen Pre-procedure details:    Skin preparation:  Betadine Anesthesia (see MAR for exact dosages):    Anesthesia method:  Local infiltration   Local anesthetic:  Lidocaine 1% w/o epi Procedure type:    Complexity:  Simple Procedure details:    Incision types:  Stab incision   Scalpel blade:  11   Wound management:  Probed and deloculated and irrigated with saline   Drainage amount:  Moderate   Wound treatment:  Drain placed   Packing materials:  1/4 in iodoform gauze Post-procedure details:    Patient tolerance of procedure:  Tolerated well, no immediate complications   (including critical care time)  Medications Ordered in ED Medications  fentaNYL (SUBLIMAZE) injection 50 mcg (50 mcg Intravenous Given 06/18/18 1055)  lidocaine (PF) (XYLOCAINE) 1 % injection 10 mL (10 mLs Intradermal Given by Other 06/18/18 1054)  HYDROcodone-acetaminophen (NORCO/VICODIN) 5-325 MG per tablet 2 tablet (2 tablets Oral Given 06/18/18 1131)     Initial Impression / Assessment and Plan / ED Course  I have reviewed the triage vital signs and the nursing notes.  Pertinent labs & imaging results that were  available during my care of the patient were reviewed by me and considered in my medical decision making (see chart for details).     32 y.o. M who presents for evaluation of possible abscess to suprapubic region that is been ongoing for last 6 days.  Reports that he went to urgent care yesterday and was told was too deep to cut.  Was given a shot of Rocephin and discharged home on Keflex, Bactrim which he has been taking since yesterday.  Reports he had a temperature of 99.2 in the urgent care but no other fevers.  No urinary complaints.  Patient reports that the area of redness has increased from the borders that he had drawn prompting ED visit today. Patient is afebrile, non-toxic appearing, sitting comfortably on examination table. Vital signs reviewed and stable.  On exam, he has a area of warmth, erythema  with palpable mass noted to the suprapubic region.  No fluctuance.  Bedside ultrasound applied.  There are small pockets of abscess noted with surrounding inflammatory changes suggestive of cellulitis.  No bowel noted on ultrasound.  While the area does not appear to be a strangulated hernia, it is more indurated than fluctuant.   Plan for basic labs.  Discussed patient with Dr. Denton LankSteinl who independently evaluated patient.  Will hold on CT abdomen pelvis at this time as this feels to be more of an abscess rather than hernia..  Will do I&D.  CBC shows leukocytosis of 11.4.  Otherwise unremarkable.  BMP unremarkable.  Incision and drainage as documented above.  Patient tolerated procedure well.  Drain placed for continued drainage.  Patient is very currently on Keflex and Bactrim.  Directed him to continue taking antibiotics.  Instructed him on wound care precautions.  We will have patient return in 48 hours for drain removal wound check. Patient had ample opportunity for questions and discussion. All patient's questions were answered with full understanding. Strict return precautions discussed.  Patient expresses understanding and agreement to plan.    EMERGENCY DEPARTMENT US SOFT TISSUE INTERPRETATION "Study: Limited Soft Tissue Ultrasound"  INDICATIONS: Pain and Soft tissue infection Multiple views of the body part were obtained in real-time with a multi-frequency linear probe  PERFORMED BY: Myself IMAGES ARCHIVED?: Yes SIDE:Midline BODY PART:Abdominal wall INTERPRETATION:  Abcess present and Cellulitis present   Final Clinical Impressions(s) / ED Diagnoses   Final diagnoses:  Abscess    ED Discharge Orders    None       Rosana HoesLayden, Ebony Rickel A, PA-C 06/18/18 1208    Cathren LaineSteinl, Kevin, MD 06/18/18 1500

## 2018-06-18 NOTE — Discharge Instructions (Signed)
Apply warm compresses to the area or soak the area in warm water to help continue express drainage.   Keep the wound clean and dry. Gently wash the wound with soap and water and make sure to pat it dry.    Take antibiotic and complete the entire course.   You can take Tylenol or Ibuprofen as directed for pain.  [facility]  in 2 days for wound recheck and packing removal.   Return to the Emergency Department if you experienced any worsening/spreading redness or swelling, fever, worsening pain, or any other worsening or concerning symptoms.

## 2018-06-18 NOTE — ED Triage Notes (Signed)
Pt reports abscess to "beltline"; [facility]; sts worse today

## 2018-06-20 ENCOUNTER — Encounter (HOSPITAL_BASED_OUTPATIENT_CLINIC_OR_DEPARTMENT_OTHER): Payer: Self-pay | Admitting: *Deleted

## 2018-06-20 ENCOUNTER — Other Ambulatory Visit: Payer: Self-pay

## 2018-06-20 ENCOUNTER — Emergency Department (HOSPITAL_BASED_OUTPATIENT_CLINIC_OR_DEPARTMENT_OTHER)
Admission: EM | Admit: 2018-06-20 | Discharge: 2018-06-20 | Disposition: A | Discharge status: Home / Self Care | Payer: Self-pay | Attending: Emergency Medicine | Admitting: Emergency Medicine

## 2018-06-20 DIAGNOSIS — L02211 Cutaneous abscess of abdominal wall: Secondary | ICD-10-CM | POA: Insufficient documentation

## 2018-06-20 DIAGNOSIS — Z5189 Encounter for other specified aftercare: Secondary | ICD-10-CM | POA: Insufficient documentation

## 2018-06-20 DIAGNOSIS — Z87891 Personal history of nicotine dependence: Secondary | ICD-10-CM | POA: Insufficient documentation

## 2018-06-20 DIAGNOSIS — Z79899 Other long term (current) drug therapy: Secondary | ICD-10-CM | POA: Insufficient documentation

## 2018-06-20 NOTE — Discharge Instructions (Addendum)
As we discussed repack wound just slightly every 2 days with the ribbon of gauze.  Will probably need to continue this for about 2 weeks total.  Continue the antibiotics that you are on.  Return for any new or worse symptoms.

## 2018-06-20 NOTE — ED Triage Notes (Signed)
Pt. Reports he has been changing dressing every day 2-3 times a day.

## 2018-06-20 NOTE — ED Provider Notes (Signed)
[facility] EMERGENCY DEPARTMENT Provider Note   CSN: [id number] Arrival date & time: 06/20/18  1042     History   Chief Complaint Chief Complaint  Patient presents with  . Abscess    remove packing    HPI Thomas Jarvis is a 32 y.o. male.  Patient status post I&D of large abscess in the suprapubic area on June 24.  Wound packed with iodoform gauze.  Patient in for wound check and packing removal.  Patient states overall things are doing much better.  Patient is on antibiotics.     History reviewed. No pertinent past medical history.  Patient Active Problem List   Diagnosis Date Noted  . 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 ANTERIOR CERVICAL DECOMP/DISCECTOMY FUSION N/A 09/12/2017   Procedure: CERVICAL SIX-SEVEN ANTERIOR CERVICAL DECOMPRESSION/DISCECTOMY FUSION;  Surgeon: Coletta Memosabbell, Kyle, MD;  [facility];  Service: Neurosurgery;  Laterality: N/A;        Home Medications    Prior to Admission medications   Medication Sig Start Date End Date Taking? Authorizing Provider  cephALEXin (KEFLEX) 500 MG capsule Take 500 mg by mouth 4 (four) times daily.    [provider]  HYDROcodone-acetaminophen (NORCO/VICODIN) 5-325 MG tablet Take 1 tablet by mouth every 6 (six) hours as needed for moderate pain. 09/13/17   Coletta Memosabbell, Kyle, MD  sulfamethoxazole-trimethoprim (BACTRIM DS,SEPTRA DS) 800-160 MG tablet Take 1 tablet by mouth 2 (two) times daily.    [provider]  tiZANidine (ZANAFLEX) 4 MG tablet Take 1 tablet (4 mg total) by mouth every 6 (six) hours as needed for muscle spasms. 09/13/17   Coletta Memosabbell, Kyle, MD    Family History History reviewed. No pertinent family history.  Social History Social History   Tobacco Use  . Smoking status: Former Smoker    Packs/day: 1.00    Types: Cigarettes  . Smokeless tobacco: Never Used  Substance Use Topics  . Alcohol use: Yes      Comment: occ  . Drug use: Not Currently     Allergies   Patient has no known allergies.   Review of Systems Review of Systems  Constitutional: Negative for fever.  HENT: Negative for congestion.   Respiratory: Negative for shortness of breath.   Cardiovascular: Negative for chest pain.  Gastrointestinal: Positive for abdominal pain.  Genitourinary: Negative for difficulty urinating and dysuria.  Musculoskeletal: Negative for back pain.  Skin: Positive for wound.  Neurological: Negative for headaches.  Hematological: Does not bruise/bleed easily.  Psychiatric/Behavioral: Negative for confusion.     Physical Exam Updated Vital Signs BP 127/77 (BP Location: Right Arm)   Pulse 70   Temp 98.6 F (37 C) (Oral)   Resp 18   SpO2 100%   Physical Exam  Constitutional: He appears well-developed and well-nourished. No distress.  HENT:  Head: Normocephalic and atraumatic.  Mouth/Throat: Oropharynx is clear and moist.  Eyes: Pupils are equal, round, and reactive to light. EOM are normal.  Neck: Neck supple.  Cardiovascular: Normal rate.  Pulmonary/Chest: Effort normal. No respiratory distress.  Abdominal: Soft. There is tenderness.  Suprapubic incision measuring about 2 to 3 cm.  No surrounding or induration measuring about 4 cm.  No significant erythema no further fluctuance.  I had a form gauze in place.  Nursing note and vitals reviewed.    ED Treatments / Results  Labs (all labs ordered are listed, but only abnormal results are displayed) Labs  Reviewed - No data to display  EKG None  Radiology No results found.  Procedures Procedures (including critical care time)  Medications Ordered in ED Medications - No data to display   Initial Impression / Assessment and Plan / ED Course  I have reviewed the triage vital signs and the nursing notes.  Pertinent labs & imaging results that were available during my care of the patient were reviewed by me and  considered in my medical decision making (see chart for details).     Iodoform gauze removed.  Large abscess cavity no further evidence of any abscess fluid collection.  We will have patient just redress every 2 days with a ribbon of the iodoform gauze.  Supplies provided to prevent the skin from ceiling.  Since the abscess cavity is fairly deep.  Patient will continue to do this until the abscess cavity is almost completely healed.  They will return for any new or worse symptoms.  Final Clinical Impressions(s) / ED Diagnoses   Final diagnoses:  Wound check, abscess    ED Discharge Orders    None       Vanetta Mulders, MD 06/20/18 1132
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
## 2018-11-25 DEATH — deceased
